# Patient Record
Sex: Male | Born: 1980 | Marital: Single
Health system: Southern US, Community
[De-identification: ages and names within clinical notes are randomized; demographics above are authoritative.]

---

## 2011-01-06 ENCOUNTER — Inpatient Hospital Stay: Payer: Self-pay | Admitting: Psychiatry

## 2011-01-10 DIAGNOSIS — Z0389 Encounter for observation for other suspected diseases and conditions ruled out: Secondary | ICD-10-CM

## 2011-01-17 ENCOUNTER — Ambulatory Visit: Payer: Self-pay | Admitting: Psychiatry

## 2011-01-31 ENCOUNTER — Ambulatory Visit: Payer: Self-pay | Admitting: Psychiatry

## 2011-04-18 ENCOUNTER — Inpatient Hospital Stay: Payer: Self-pay | Admitting: Psychiatry

## 2011-05-07 ENCOUNTER — Ambulatory Visit: Payer: Self-pay | Admitting: Psychiatry

## 2011-06-03 ENCOUNTER — Ambulatory Visit: Payer: Self-pay | Admitting: Psychiatry

## 2011-12-21 ENCOUNTER — Inpatient Hospital Stay: Payer: Self-pay | Admitting: Psychiatry

## 2011-12-21 LAB — COMPREHENSIVE METABOLIC PANEL
Albumin: 4.2 g/dL (ref 3.4–5.0)
Anion Gap: 9 (ref 7–16)
BUN: 10 mg/dL (ref 7–18)
Bilirubin,Total: 0.7 mg/dL (ref 0.2–1.0)
Calcium, Total: 9.3 mg/dL (ref 8.5–10.1)
Chloride: 102 mmol/L (ref 98–107)
Co2: 27 mmol/L (ref 21–32)
Creatinine: 0.91 mg/dL (ref 0.60–1.30)
EGFR (African American): >60
EGFR (Non-African Amer.): >60
Glucose: 103 mg/dL — ABNORMAL HIGH (ref 65–99)
Osmolality: 275 (ref 275–301)
Potassium: 3.5 mmol/L (ref 3.5–5.1)
SGOT(AST): 11 U/L — ABNORMAL LOW (ref 15–37)
Sodium: 138 mmol/L (ref 136–145)

## 2011-12-21 LAB — URINALYSIS, COMPLETE
Blood: NEGATIVE
Glucose,UR: NEGATIVE mg/dL (ref 0–75)
Ketone: NEGATIVE
Nitrite: NEGATIVE
Ph: 6 (ref 4.5–8.0)
Protein: NEGATIVE
RBC,UR: <1 /HPF (ref 0–5)
Specific Gravity: 1.016 (ref 1.003–1.030)
Squamous Epithelial: 1
WBC UR: 2 /HPF (ref 0–5)

## 2011-12-21 LAB — SALICYLATE LEVEL: Salicylates, Serum: 2.9 mg/dL — ABNORMAL HIGH

## 2011-12-21 LAB — CBC
HCT: 48.5 % (ref 40.0–52.0)
MCHC: 34.1 g/dL (ref 32.0–36.0)
RBC: 5.57 10*6/uL (ref 4.40–5.90)
RDW: 14.1 % (ref 11.5–14.5)

## 2011-12-21 LAB — DRUG SCREEN, URINE
Amphetamines, Ur Screen: NEGATIVE (ref ?–1000)
Benzodiazepine, Ur Scrn: NEGATIVE (ref ?–200)
Cannabinoid 50 Ng, Ur ~~LOC~~: NEGATIVE (ref ?–50)
Cocaine Metabolite,Ur ~~LOC~~: NEGATIVE (ref ?–300)
MDMA (Ecstasy)Ur Screen: POSITIVE (ref ?–500)
Phencyclidine (PCP) Ur S: NEGATIVE (ref ?–25)
Tricyclic, Ur Screen: NEGATIVE (ref ?–1000)

## 2011-12-21 LAB — ETHANOL: Ethanol: <3 mg/dL

## 2012-01-06 ENCOUNTER — Ambulatory Visit: Payer: Self-pay | Admitting: Psychiatry

## 2012-01-06 LAB — LITHIUM LEVEL: Lithium: 1.02 mmol/L

## 2012-01-31 ENCOUNTER — Ambulatory Visit: Payer: Self-pay | Admitting: Psychiatry

## 2012-11-19 LAB — URINALYSIS, COMPLETE
Bacteria: NONE SEEN
Bilirubin,UR: NEGATIVE
Blood: NEGATIVE
Glucose,UR: NEGATIVE mg/dL (ref 0–75)
Ketone: NEGATIVE
Nitrite: NEGATIVE
Ph: 5 (ref 4.5–8.0)
Specific Gravity: 1.005 (ref 1.003–1.030)
WBC UR: 2 /HPF (ref 0–5)

## 2012-11-19 LAB — CBC
HGB: 17.5 g/dL (ref 13.0–18.0)
MCH: 28.5 pg (ref 26.0–34.0)
MCHC: 34.1 g/dL (ref 32.0–36.0)
WBC: 6 10*3/uL (ref 3.8–10.6)

## 2012-11-19 LAB — ACETAMINOPHEN LEVEL: Acetaminophen: <2 ug/mL

## 2012-11-19 LAB — COMPREHENSIVE METABOLIC PANEL
Albumin: 4.6 g/dL (ref 3.4–5.0)
Alkaline Phosphatase: 86 U/L (ref 50–136)
BUN: 13 mg/dL (ref 7–18)
Calcium, Total: 9.7 mg/dL (ref 8.5–10.1)
Chloride: 107 mmol/L (ref 98–107)
Co2: 25 mmol/L (ref 21–32)
Creatinine: 1.23 mg/dL (ref 0.60–1.30)
EGFR (African American): >60
EGFR (Non-African Amer.): >60
Potassium: 3.9 mmol/L (ref 3.5–5.1)
SGPT (ALT): 32 U/L (ref 12–78)
Sodium: 139 mmol/L (ref 136–145)
Total Protein: 9.3 g/dL — ABNORMAL HIGH (ref 6.4–8.2)

## 2012-11-19 LAB — DRUG SCREEN, URINE
Amphetamines, Ur Screen: NEGATIVE (ref ?–1000)
Barbiturates, Ur Screen: NEGATIVE (ref ?–200)
Cannabinoid 50 Ng, Ur ~~LOC~~: NEGATIVE (ref ?–50)
MDMA (Ecstasy)Ur Screen: NEGATIVE (ref ?–500)
Methadone, Ur Screen: NEGATIVE (ref ?–300)
Opiate, Ur Screen: POSITIVE (ref ?–300)
Phencyclidine (PCP) Ur S: NEGATIVE (ref ?–25)

## 2012-11-19 LAB — ETHANOL
Ethanol %: <0.003 % (ref 0.000–0.080)
Ethanol: <3 mg/dL

## 2012-11-20 ENCOUNTER — Inpatient Hospital Stay: Payer: Self-pay | Admitting: Psychiatry

## 2012-12-29 ENCOUNTER — Ambulatory Visit: Payer: Self-pay | Admitting: Psychiatry

## 2012-12-31 ENCOUNTER — Ambulatory Visit: Payer: Self-pay | Admitting: Psychiatry

## 2013-09-30 DEATH — deceased

## 2014-02-07 IMAGING — CR DG CHEST 2V
1 series · 2 of 2 positions shown · non-contrast
Comparison: none

REASON FOR EXAM: ect workup
COMMENTS:

PROCEDURE:     DXR - DXR CHEST PA (OR AP) AND LATERAL  - December 21, 2011 [DATE]
RESULT:     Comparison: None

[Series 1: pa · 0.17mm/px · 2 of 2 slices shown]
[im 1/2]
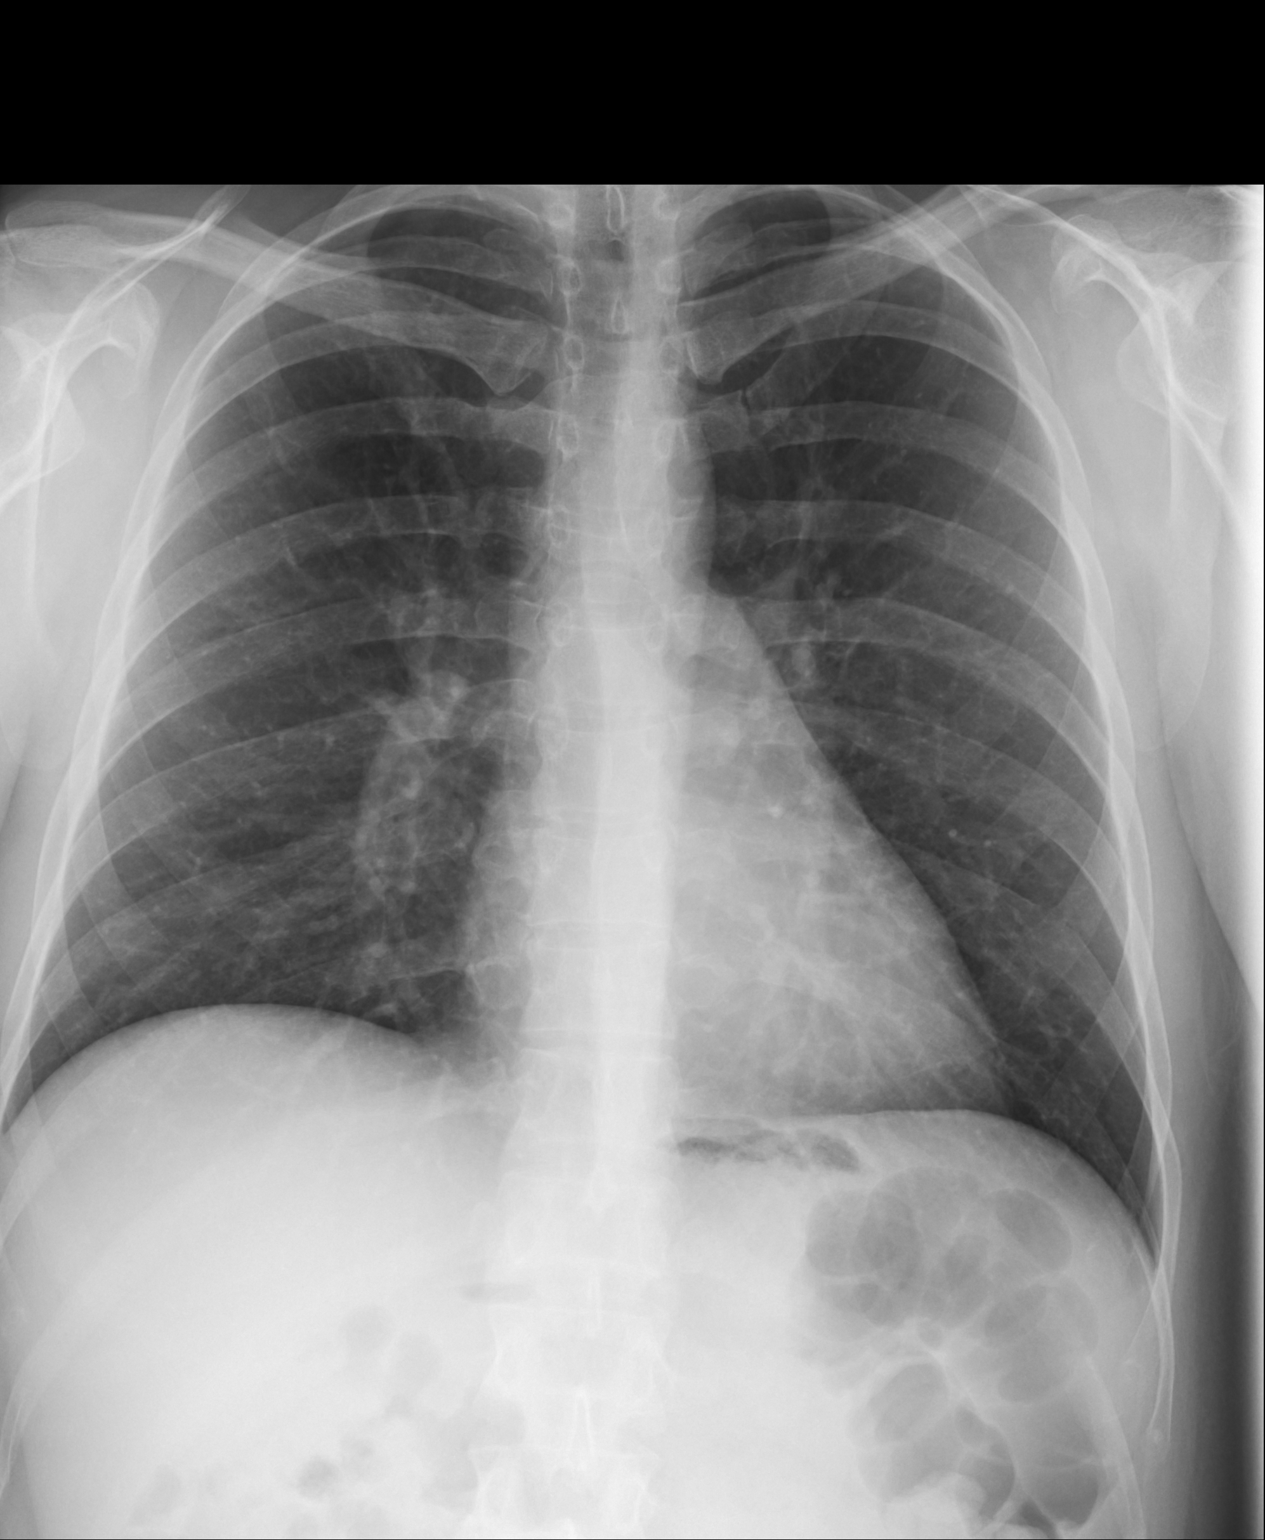
[im 2/2]
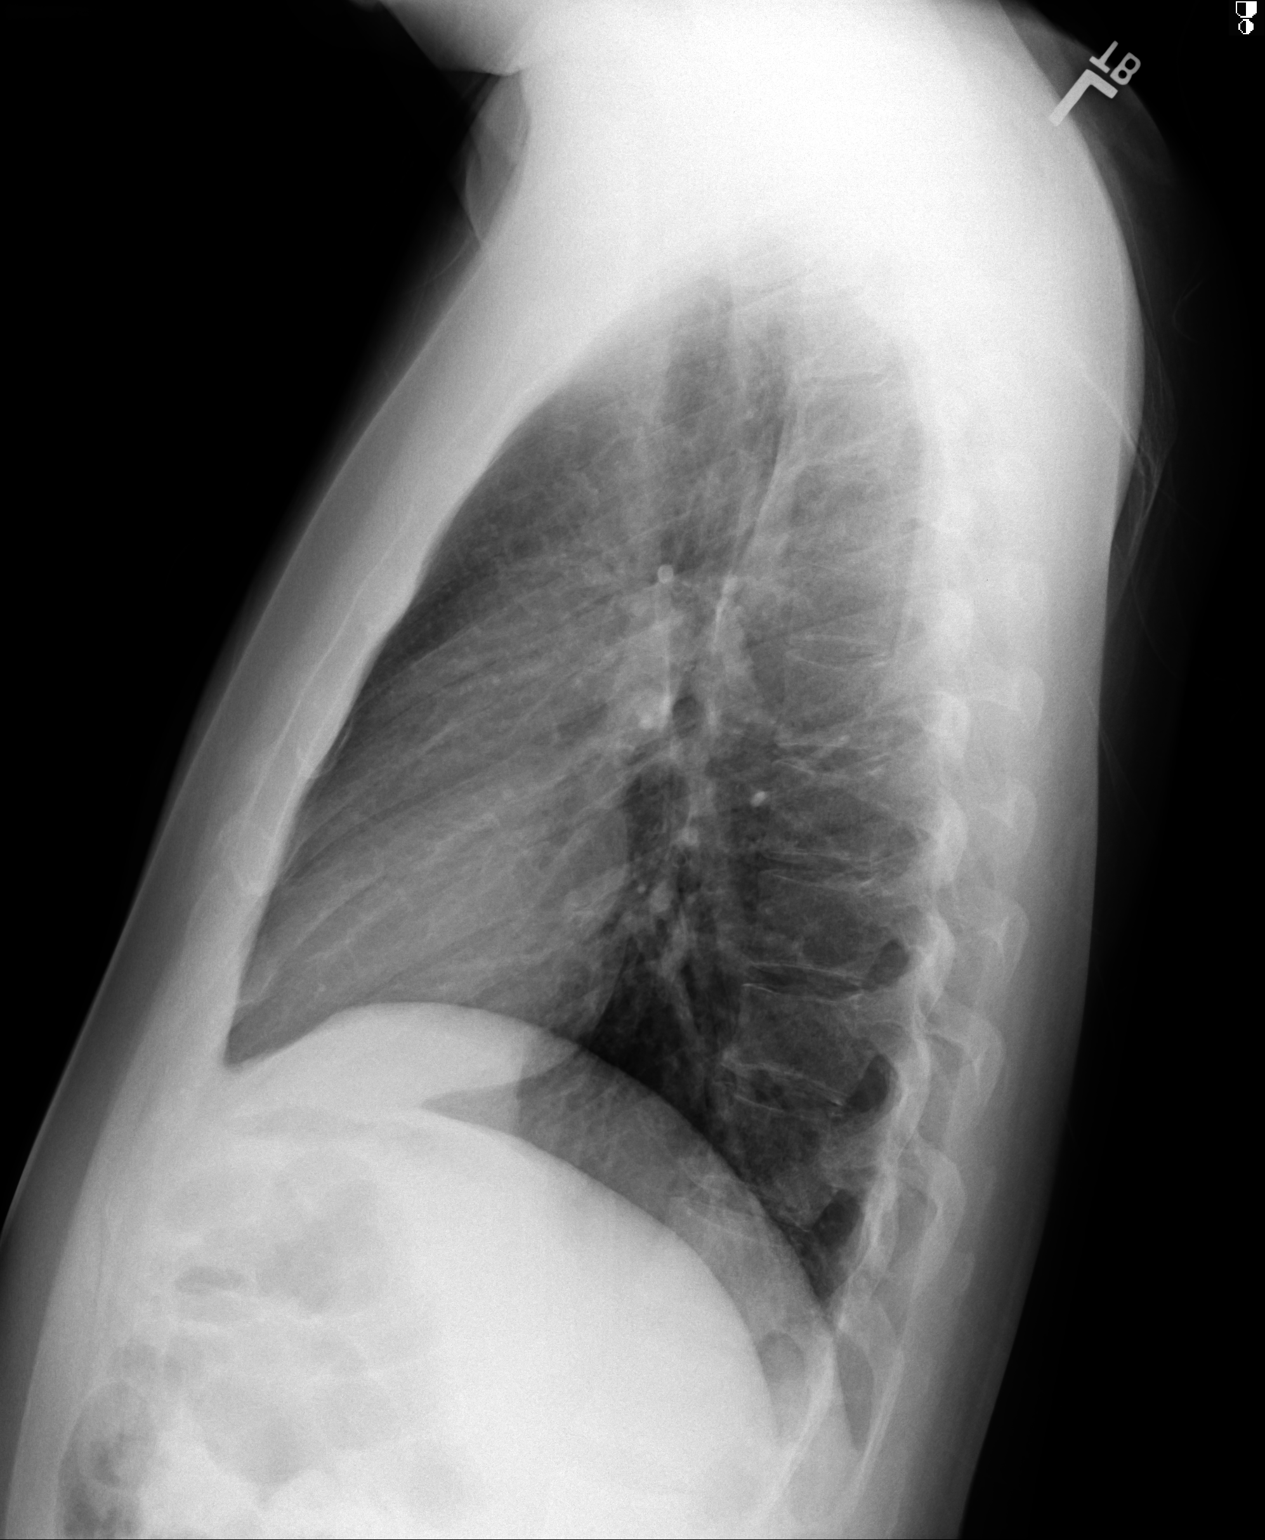

[2 of 2 positions shown; findings below may reference images not displayed]

FINDINGS: PA and lateral chest radiographs are provided.  There is no focal
parenchymal opacity, pleural effusion, or pneumothorax. The heart and
mediastinum are unremarkable.  The osseous structures are unremarkable.
IMPRESSION: No acute disease of the che[REDACTED]

## 2014-08-19 NOTE — Discharge Summary (Signed)
PATIENT NAME:  Walter Rojas, [place] R MR#:  [id number] DATE OF BIRTH:  December 25, 1980  DATE OF ADMISSION:  12/21/2011 DATE OF DISCHARGE:  01/06/2012  HOSPITAL COURSE: See dictated History and Physical for details of admission. This 34 year old man with a history of severe, recurrent depression came to the Emergency Room requesting admission and resumption of ECT. He had been having a worsening of his depression with some suicidal thoughts, extreme withdrawal, sadness, and perhaps the beginnings of some paranoia. In the hospital, the patient was continued on his current outpatient medications for his depression including his Cymbalta and Abilify. Because of his past history of unique response to ECT we agreed from the beginning that the plan would be to resume ECT. The patient started having ECT and at this point has had six bilateral treatments. He has tolerated treatments well with no complications or side effects. He does not complain of any significant memory or cognitive difficulties. He has shown a gradual but clear-cut improvement. At one point in the middle of his stay he had a return briefly of some passive suicidal thoughts but these have now cleared up entirely. For the last couple of days his affect has been brighter. He is smiling more and appears more energetic. He is taking care of himself better. He has continued to tolerate ECT well but at this point is agreeable to going back home. In the past we have had some trouble with arranging maintenance ECT for him. We have arranged for him to have an ACT team followup in the community and at this point there is an agreement with his family that if we can start with every other week ECT that should be doable for them. Also, because of the return of some suicidal thoughts I reviewed his medication and suggested to him that we begin lithium as a complimentary medicine for his depression. He was agreeable to this and has now been on lithium for several days. He has  tolerated lithium well and had no side effects or complaints. Dose has been increased to 600 mg at bedtime. A blood level done today was 1.02. He is not having any sign of lithium toxicity. He was continued on his usual pain medicine per his home routine and has not had any new complaints about it.   DISCHARGE MEDICATIONS:  1. Lithium carbonate 600 mg at night.  2. Oxycodone 20 mg q. 6 hours p.r.n. for pain.  3. Trazodone 150 mg at bedtime.  4. Vistaril 50 mg every six hours p.r.n. for anxiety.  5. Fentanyl patch 75 mcg every other day.  6. Cymbalta 60 mg twice a day.  7. Aripiprazole 15 mg at night.   LABORATORY RESULTS: Admission labs showed urinalysis was unremarkable. Drug screen positive for MDMA and opiates. CBC normal. Chemistry panel shows slightly elevated glucose at 103. AST low at 11. Alcohol undetectable. TSH 0.52. EKG showed a normal sinus rhythm. Blood glucoses were consistently within normal limits. Lithium level done at the time of discharge was 1.02. His CBC was normal.   MENTAL STATUS EXAM AT DISCHARGE: Casually but neatly dressed young man who looks his stated age. Cooperative with the interview. Good eye contact, normal psychomotor activity. Speech is normal in rate, tone, and volume. Affect is still a little bit flat, which is his baseline. Mood stated as being good. Thoughts are lucid with no evidence of loosening of associations or delusions. Denies hallucinations. Denies any suicidal or homicidal ideation. Reports that he is feeling optimistic and not  hopeless. Intelligence normal. Short-term memory grossly intact. Long-term memory intact. Good insight and judgment.   DIAGNOSIS PRINCIPLE AND PRIMARY:  AXIS I: Major depressive episode, severe, recurrent.   SECONDARY DIAGNOSES:  AXIS I: No further.   AXIS II: Deferred.   AXIS III: Chronic pain requiring chronic narcotic use.   AXIS IV: Moderate to severe from disability, chronic pain, and limited resources at home.    AXIS V: Functioning at time of discharge it is 60.   ____________________________ Audery Amel, MD jtc:bjt D: 01/06/2012 12:49:39 ET T: 01/09/2012 11:05:29 ET JOB#: [id number]  cc: Audery Amel, MD, <Dictator> Audery Amel MD ELECTRONICALLY SIGNED 01/10/2012 0:14
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
## 2014-08-22 NOTE — Discharge Summary (Signed)
PATIENT NAME:  Walter Rojas, Walter Rojas MR#:  [id number] DATE OF BIRTH:  [birth year]  DATE OF ADMISSION:  11/20/2012 DATE OF DISCHARGE:  11/30/2012  HOSPITAL COURSE: See dictated history and physical for details of admission. This 34 year old gentleman with a history of major depression and chronic pain was admitted to the hospital for ECT treatment. He had a history of good response to ECT treatments here in the past and had come here specifically with that in mind. The patient was reporting depression and some suicidal ideation and some hallucinations. He had been compliant with his medicine. We had some concern initially that he could be abusing his opiate pain medicines based on the very large amounts of prescriptions that are documented as having been filled in the last month. Having checked that against the database, it appears that all of them were legitimately prescribed by his outpatient pain management team and that actually they are no more than his usual doses. He simply is maintained on high doses of narcotics. The patient denied that he had been abusing them. In the hospital, he was maintained on the regular p.r.n. doses of plain oxycodone, and we tried keep him on a fentanyl patch as his maintenance medicine, but about halfway through the hospitalization he lost his fentanyl patch. We could not replace it and so we switched him over for the last couple of days to OxyContin, which he had been taking at home. At the time of discharge, he is instructed to simply follow up with pain management, with his primary doctors, with his own medicine in the way he had been doing previously. The patient was started on ECT treatment and has had 4 treatments as of today. In the last few days, he has a shown significant improvement in his mood. He is no longer voicing any suicidal ideation. He describes his mood as being really good. He feels like he is back to his baseline, free of depression symptoms. He is not having  any psychotic symptoms. He has actually done a good job mingling on the ward, interacting with other people as well, which in the past has been an issue for him. We are going to discharge him today. He will follow up with [facility] for his outpatient medical psychiatric treatment. I would like to see him for maintenance ECT and have made this clear to him before, but he has severe transportation difficulties given how far away he lives  and limited resources of his family. He thinks that he should be able to come back maybe once a month. Therefore, we will schedule a maintenance ECT treatment on 08/29, which is in 4 weeks. The patient is instructed to make plans for that and if he needs to change it call us as soon as possible.   MENTAL STATUS EXAM AT DISCHARGE: Casually dressed and groomed gentleman, looks his stated age, cooperative with the interview. Good eye contact. Normal psychomotor activity. Speech normal rate, tone and volume. Affect euthymic, reactive, appropriate. Mood stated as real good. Thoughts are lucid without any loosening of associations or delusions. Denies auditory or visual hallucinations. Denies suicidal or homicidal ideation. Shows improved judgment and insight. Normal intelligence. Alert and oriented x 4.   DISCHARGE MEDICATIONS:  1.  Cymbalta 60 mg b.i.d. 2.  Abilify 15 mg a day. 3.  Seroquel 100 to 300 mg at night as needed. 4.  Xanax 2 mg up to 3 times a day as needed. 5.  OxyContin 20 mg twice a day.  6.  Oxycodone 20 mg q. 6 hours p.r.n.   LABORATORY RESULTS: Admission labs showed drug screen positive for opiates and benzodiazepines. TSH normal at 1.1. Alcohol undetected. Chemistry showed an elevated protein at 9.3, otherwise unremarkable. CBC elevated, red count at 6.1, nothing remarkable. Urinalysis normal.   DISPOSITION: Discharge home with family. Follow up with [facility] and come back for maintenance ECT in a month.   DIAGNOSIS, PRINCIPAL AND PRIMARY:  AXIS  I: Major depression, severe, recurrent with psychotic features and now improved.   SECONDARY DIAGNOSES: AXIS I: Social anxiety disorder.  AXIS II: Deferred.  AXIS III: Chronic pain from multiple orthopedic injuries and surgeries.  AXIS IV: Severe from his disability, chronic pain and illness.  AXIS V: Functioning at time of discharge 60.  ____________________________ Audery AmelJohn T. Sherl Yzaguirre, MD jtc:sb D: 11/30/2012 12:10:16 ET T: 11/30/2012 12:35:12 ET JOB#: [id number]  cc: Audery AmelJohn T. Hazelle Woollard, MD, <Dictator> Audery AmelJOHN T Dea Bitting MD ELECTRONICALLY SIGNED 12/02/2012 18:31

## 2014-08-22 NOTE — Consult Note (Signed)
PATIENT NAME:  Walter Rojas, HAMRE MR#:  [id number] DATE OF BIRTH:  1981/01/03  DATE OF CONSULTATION:  11/20/2012  REFERRING PHYSICIAN:  [name], MD CONSULTING PHYSICIAN:  [name]. [name], MD  REASON FOR CONSULTATION: "I tried to kill myself."   HISTORY OF PRESENT ILLNESS:  The patient is a 34 year old single Caucasian male with long history of major depression who presented to the ED accompanied by his neighbor as he reported that he has tried to kill himself after he took a overdose on Seroquel and Abilify in a suicide attempt.   During my interview, the patient reported that he was feeling very depressed, was hearing voices and was feeling suicidal. He reported that he took approximately 3000 mg of Seroquel after he googled that he is supposed to take 2000 mg to kill himself. He took more than 2000 mg as he was trying to hurt himself. He also took 10 to 15 pills of 15 mg Abilify. The patient reported that he took some of the pain pills, including Percocet and OxyContin, as he was feeling severely depressed and was having persistent suicidal ideation. The patient was requesting inpatient admission as he stated that he cannot remain safe if he was not admitted. The patient stated that he was tired of living. He is currently living with his parents.  He stated that he was admitted to the inpatient unit in August 2013 and at that time he received some ECT treatment by Dr. [name]. He was able to come for 2 to 3 times after that, but was not able to continue his maintenance ECT due to transportation issues. He started following with [name], nurse practitioner, who adjusted his medications. She reduced Seroquel, but he started having auditory hallucinations including command auditory hallucinations. He last time heard the voices this morning. He reported that the voices ask him to hurt himself. The patient reported that he is unable to contract for safety. He is requesting admission.   He was also  asking that he should not be placed back on the Xanax as he is going to get more ECT treatments as he has received ECT several times in the past and was it helpful while he is getting the treatment. The patient appeared somewhat depressed, but was asking for ECT especially at this time. The patient was also asking for his pain medications, including OxyContin and Percocet, and appeared to be anxious about getting more doses of the pain medications.   PAST PSYCHIATRIC HISTORY: The patient has long history of depression and has been admitted multiple times in the past. He received 2 courses of ECT.  His last course was in August 2013. He showed good response to ECT in the past. He also has received ECT at [facility], [facility] and [place] as well as here. He reported that he becomes noncompliant because of transportation issues. His parents are unable to provide him transportation. The patient reported that he does not have any history of drug abuse, but appeared to be dependent on the pain medications. He denied using any drugs at this time. He reported that he has tried to hurt himself multiple times in the past including by shooting himself, overdose, and also tried to slit his wrists.   MEDICAL HISTORY: The patient has history of chronic pain because of multiple traumatic injuries, which were self-inflicted. He also recently had knee replacement surgery. He has been on chronic narcotics and has never been able to come off of them.   FAMILY  HISTORY: Chronic depression in his family.   SOCIAL HISTORY: The patient is currently disabled and lives with his parents. 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 He has never been married and has a 34 year old daughter.   CURRENT MEDICATIONS:  1.  Xanax 2 mg p.o. t.i.d.  2.  Seroquel 100 to 300 mg at bedtime. 3.  Percocet 10/325 mg 3 tablets 4 times a day. 4.  Oxycodone 20 mg q. 6 hours.  5.  Cymbalta 60 mg b.i.d. 6.   Abilify 50 mg once a day at bedtime.  ALLERGIES: MORPHINE AND BEE STINGS.  REVIEW OF SYSTEMS: CONSTITUTIONAL: Denies any fever or chills. No weight changes.  EYES: No double or blurred vision.  RESPIRATORY: No shortness of breath or cough.  CARDIOVASCULAR: No chest pain or orthopnea.  GASTROINTESTINAL: Complaining of some abdominal pain. No diarrhea or vomiting noted.  GENITOURINARY: No incontinence or frequency.  ENDOCRINE: No heat or cold intolerance.  LYMPHATIC: No anemia or easy bruising.  INTEGUMENTARY: No acne or rash.  MUSCULOSKELETAL: Having some muscle pain.   VITAL SIGNS: Temperature 98.4, pulse 109, respirations 20, blood pressure 115/83.  LABORATORY DATA:  Glucose 100, BUN 13, creatinine 1.23, sodium 139, potassium 3.9, chloride 107, bicarbonate 25, anion gap 7, osmolality 278. Blood alcohol level less than 3. Protein 9.3, albumin 4.6, bilirubin 0.2, alkaline phosphatase 86, AST 86, ALT 32. TSH 1.11. Lithium level 0.20. Urine drug screen positive for benzodiazepines and opioids. WBC 6.0, RBC 6.14, hemoglobin 17.5, hematocrit 51.4, MCV 84, RDW 13.7.   MENTAL STATUS EXAMINATION: The patient is a casually dressed male who looks his stated age. He has multiple tattoos everywhere on his body. He maintained fair eye contact. His speech was low in tone and volume. Mood was depressed. Affect was blunted. Thought process was logical, goal-directed and focused on being admitted to the inpatient behavioral health unit. He endorsed suicidal thoughts and has recently attempted suicide. He denied having any homicidal ideations.  His memory appeared intact. He demonstrated poor insight and judgment.   DIAGNOSTIC IMPRESSION: AXIS I: Major depressive disorder, recurrent, severe with psychotic features, rule out bipolar disorder.  AXIS II: Personality disorder.  AXIS III: Chronic pain.   TREATMENT PLAN: 1.  The patient is currently under involuntary commitment and will be admitted to the  behavioral health unit for stabilization and safety.  2.  Discussed with the patient about restarting him back on Xanax 0.5 mg p.o. b.i.d. to prevent withdrawal symptoms, and he demonstrated understanding.  3.  He will be restarted back on Seroquel 100 mg p.o. at bedtime.  4.  He will be continued on Cymbalta 60 mg p.o. b.i.d.   Treatment team to follow.  The patient will be evaluated again for ECT treatment while being in the inpatient unit.  Thank you for allowing me to participate in the care of this patient.   TIME SPENT:  50 minutes.  ____________________________ Ardeen FillersUzma S. Garnetta BuddyFaheem, MD usf:sb D: 11/20/2012 13:13:15 ET T: 11/20/2012 13:37:20 ET JOB#: [id number]  cc: Ardeen FillersUzma S. Garnetta BuddyFaheem, MD, <Dictator> Rhunette CroftUZMA S Payeton Germani MD ELECTRONICALLY SIGNED 11/22/2012 15:18
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
## 2015-01-09 IMAGING — CR DG CHEST 2V
1 series · 3 of 3 positions shown · non-contrast
Comparison: none

REASON FOR EXAM: plan to begin ect [REDACTED]
COMMENTS:

PROCEDURE:     DXR - DXR CHEST PA (OR AP) AND LATERAL  - November 21, 2012  [DATE]
RESULT:     Comparison: None

[Series 1: w chest pa · 0.14mm/px · 3 of 3 slices shown]
[im 1/3]
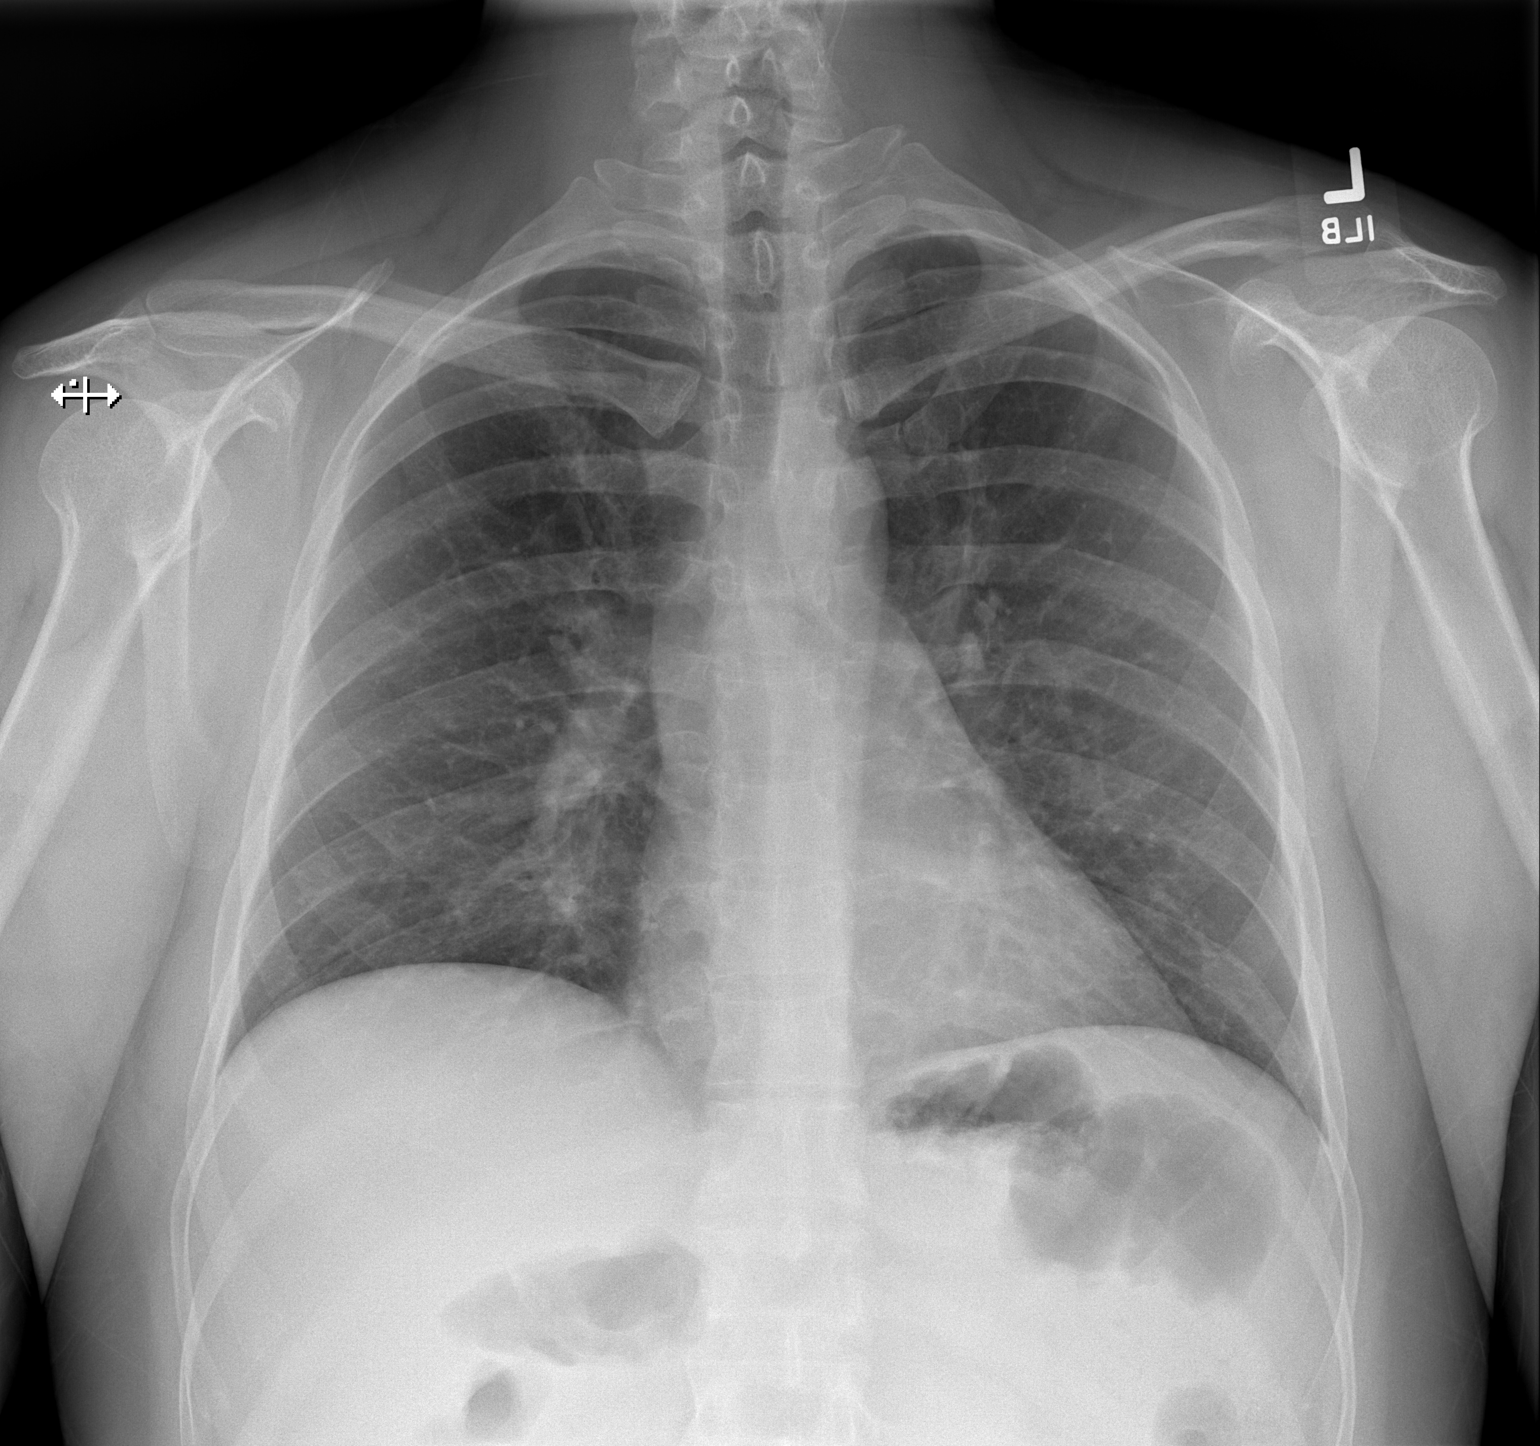
[im 2/3]
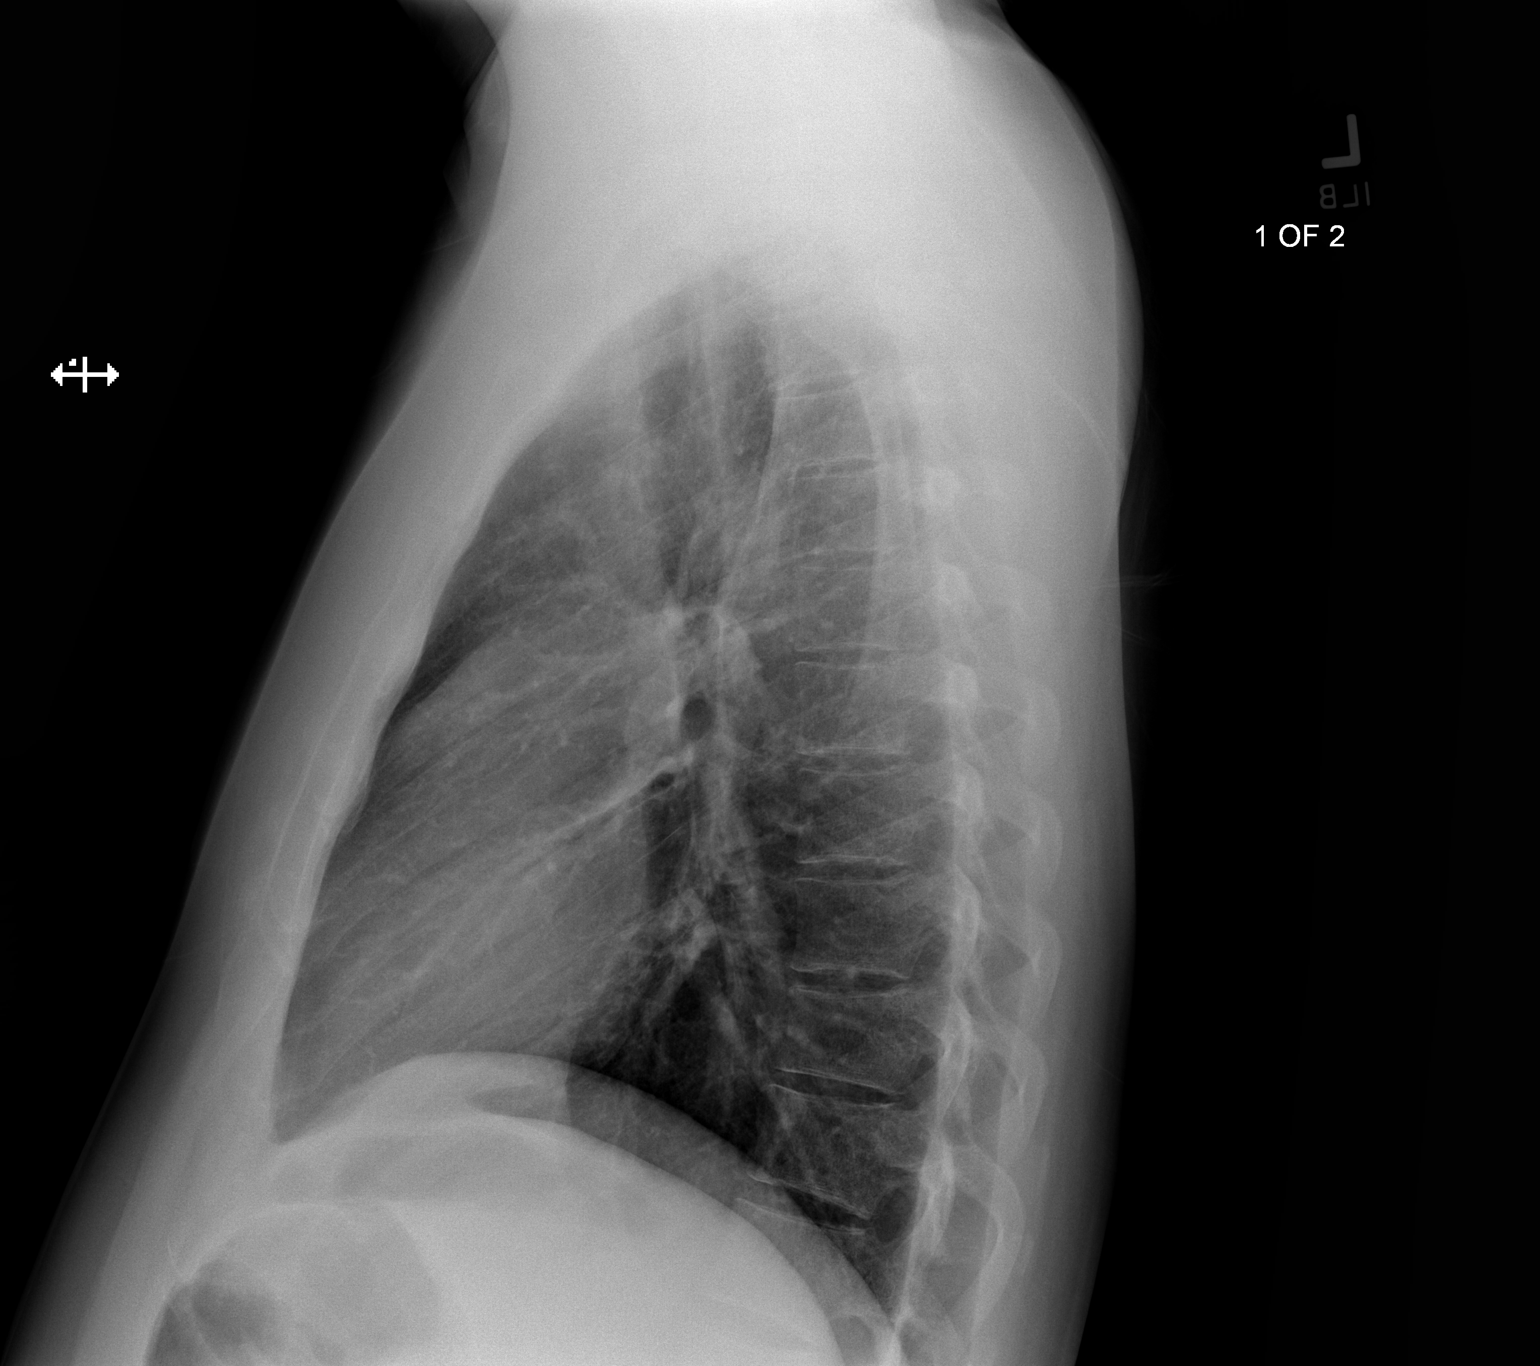
[im 3/3]
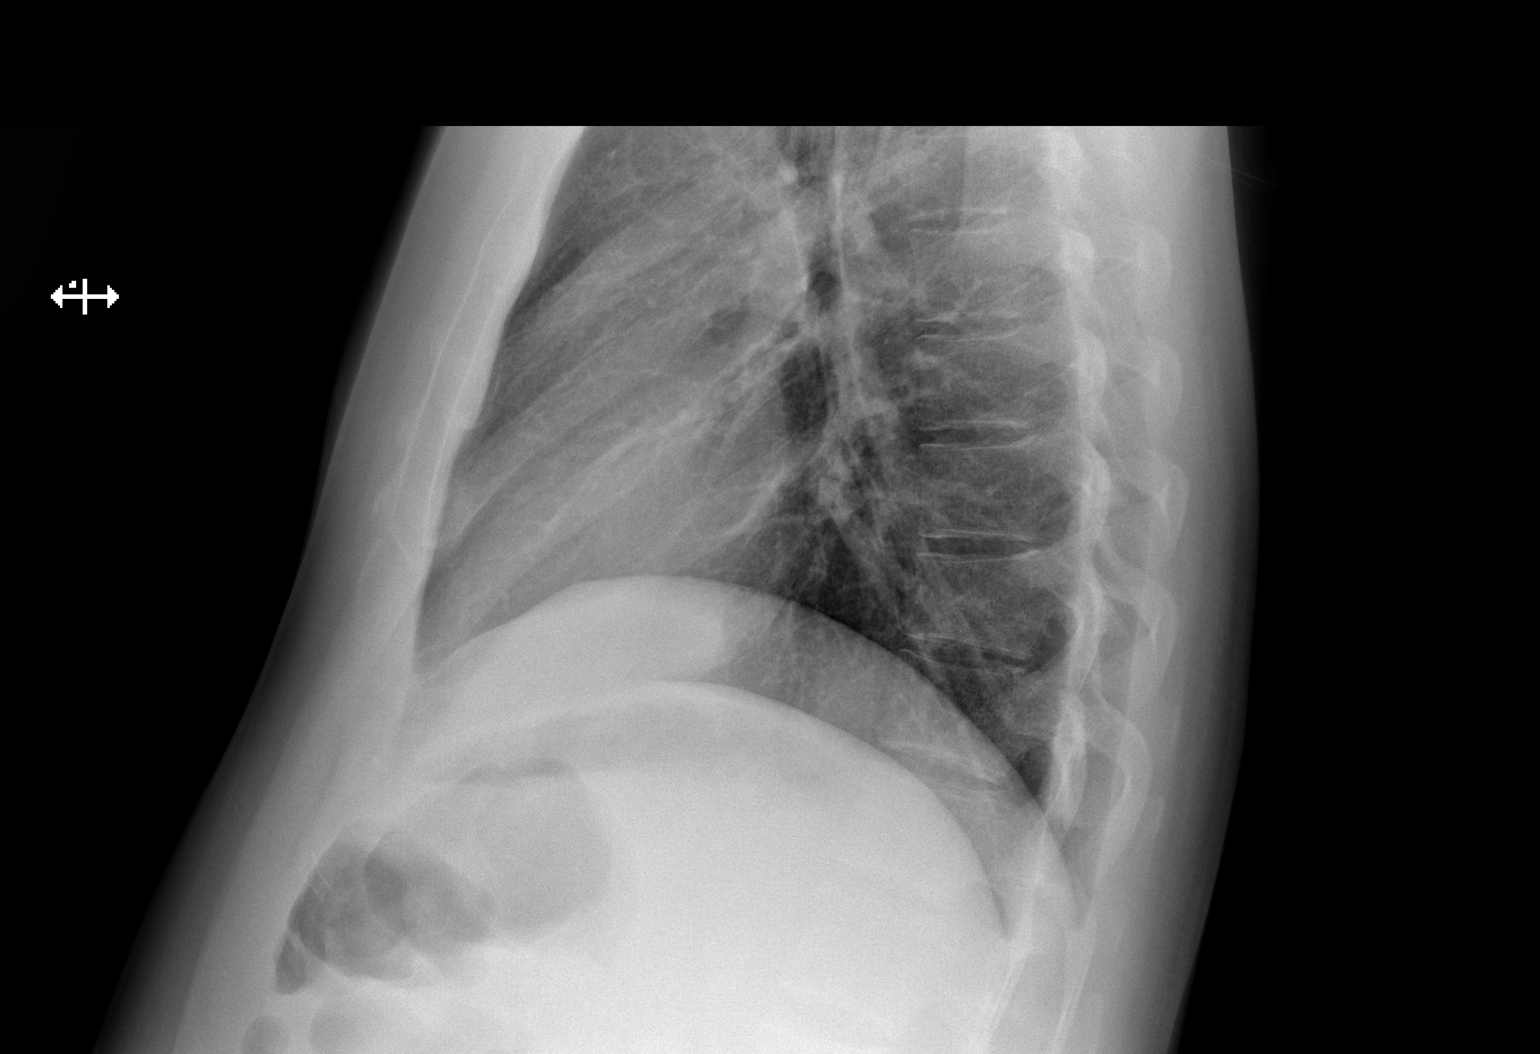

[3 of 3 positions shown; findings below may reference images not displayed]

FINDINGS: PA and lateral chest radiographs are provided.  There is no focal
parenchymal opacity, pleural effusion, or pneumothorax. The heart and
mediastinum are unremarkable.  The osseous structures are unremarkable.
IMPRESSION: No acute disease of the che[REDACTED]
